# Patient Record
Sex: Female | Born: 1970 | Race: Black or African American | Hispanic: No | Marital: Single | Smoking: Never smoker
Health system: Southern US, Community
[De-identification: ages and names within clinical notes are randomized; demographics above are authoritative.]

## PROBLEM LIST (undated history)

## (undated) DIAGNOSIS — R87629 Unspecified abnormal cytological findings in specimens from vagina: Secondary | ICD-10-CM

## (undated) DIAGNOSIS — D219 Benign neoplasm of connective and other soft tissue, unspecified: Secondary | ICD-10-CM

## (undated) HISTORY — PX: LEEP: SHX91

## (undated) HISTORY — DX: Unspecified abnormal cytological findings in specimens from vagina: R87.629

## (undated) HISTORY — DX: Benign neoplasm of connective and other soft tissue, unspecified: D21.9

---

## 2014-03-13 DIAGNOSIS — R011 Cardiac murmur, unspecified: Secondary | ICD-10-CM | POA: Insufficient documentation

## 2014-03-13 DIAGNOSIS — Z9889 Other specified postprocedural states: Secondary | ICD-10-CM | POA: Insufficient documentation

## 2015-08-14 DIAGNOSIS — D649 Anemia, unspecified: Secondary | ICD-10-CM | POA: Insufficient documentation

## 2018-09-06 ENCOUNTER — Other Ambulatory Visit: Payer: Self-pay | Admitting: Allergy

## 2018-09-06 ENCOUNTER — Ambulatory Visit
Admission: RE | Admit: 2018-09-06 | Discharge: 2018-09-06 | Disposition: A | Discharge status: Home / Self Care | Payer: BLUE CROSS/BLUE SHIELD | Source: Ambulatory Visit | Attending: Allergy | Admitting: Allergy

## 2018-09-06 DIAGNOSIS — R059 Cough, unspecified: Secondary | ICD-10-CM

## 2018-09-06 DIAGNOSIS — R05 Cough: Secondary | ICD-10-CM

## 2018-12-10 ENCOUNTER — Inpatient Hospital Stay (HOSPITAL_COMMUNITY)
Admission: AD | Admit: 2018-12-10 | Payer: BC Managed Care – PPO | Source: Home / Self Care | Admitting: Obstetrics and Gynecology

## 2019-06-06 ENCOUNTER — Other Ambulatory Visit: Payer: Self-pay | Admitting: Family Medicine

## 2019-06-06 DIAGNOSIS — Z1231 Encounter for screening mammogram for malignant neoplasm of breast: Secondary | ICD-10-CM

## 2019-06-27 NOTE — Progress Notes (Signed)
   GYNECOLOGY OFFICE VISIT NOTE  History:   Brianna Sandoval is a 49 y.o. G1P0010 here today for discussion about infertility.  Really desires pregnancy.  Had one SAB at 9 weeks last year, needed misoprostol treatment. Has been evaluated recently by GYN providers at [facility] and referred to REI there, but she wants REI providers closer to [place].  She denies any abnormal vaginal discharge, bleeding, pelvic pain or other concerns.    History reviewed. No pertinent past medical history.  History reviewed. No pertinent surgical history.  The following portions of the patient's history were reviewed and updated as appropriate: allergies, current medications, past family history, past medical history, past social history, past surgical history and problem list.   Health Maintenance:  Normal pap and positive HRHPV 16/18 on 10/2018, follow up ECC negative.  Normal mammogram last year, scheduled for one on 07/06/2019.   Review of Systems:  Pertinent items noted in HPI and remainder of comprehensive ROS otherwise negative.  Physical Exam:  BP 132/85   Pulse 72   Wt 237 lb (107.5 kg)   LMP 06/25/2019  CONSTITUTIONAL: Well-developed, well-nourished female in no acute distress.  SKIN: No rash noted. Not diaphoretic. No erythema. No pallor. MUSCULOSKELETAL: Normal range of motion. No edema noted. NEUROLOGIC: Alert and oriented to person, place, and time. Normal muscle tone coordination. No cranial nerve deficit noted. PSYCHIATRIC: Normal mood and affect. Normal behavior. Normal judgment and thought content. CARDIOVASCULAR: Normal heart rate noted RESPIRATORY: Effort and breath sounds normal, no problems with respiration noted ABDOMEN: No masses noted. No other overt distention noted.   PELVIC: Deferred     Assessment and Plan:    1. Infertility, female Discussed chances of fertility at this advanced age, recommended referral to [facility] for further evaluation and management.  Discussed increased risk  of miscarriage also due to age, increased risk of chromosomal anomalies.  She was told that one of the options discussed will be use of donor eggs.  Patient agreed to referral to Dr. Kerin Perna and his team. - Ambulatory referral to Infertility Routine preventative health maintenance measures emphasized; will return in July or August for repeat pap smear. Please refer to After Visit Summary for other counseling recommendations.   Return for any gynecologic concerns.    Total face-to-face time with patient: 15 minutes.  Over 50% of encounter was spent on counseling and coordination of care.   Verita Schneiders, MD, [facility] [employer], [place]

## 2019-06-28 ENCOUNTER — Other Ambulatory Visit: Payer: Self-pay

## 2019-06-28 ENCOUNTER — Encounter: Payer: Self-pay | Admitting: Obstetrics & Gynecology

## 2019-06-28 ENCOUNTER — Ambulatory Visit (INDEPENDENT_AMBULATORY_CARE_PROVIDER_SITE_OTHER): Payer: 59 | Admitting: Obstetrics & Gynecology

## 2019-06-28 VITALS — BP 132/85 | HR 72 | Wt 237.0 lb

## 2019-06-28 DIAGNOSIS — N979 Female infertility, unspecified: Secondary | ICD-10-CM | POA: Diagnosis not present

## 2019-06-28 NOTE — Patient Instructions (Signed)
Female Infertility  Female infertility refers to a woman's inability to get pregnant (conceive) after a year of having sex regularly (or after 6 months in women over age 48) without using birth control. Infertility can also mean that a woman is not able to carry a pregnancy to full term. Both women and men can have fertility problems. What are the causes? This condition may be caused by:  Problems with reproductive organs. Infertility can result if a woman: ? Has an abnormally short cervix or a cervix that does not remain closed during a pregnancy. ? Has a blockage or scarring in the fallopian tubes. ? Has an abnormally shaped uterus. ? Has uterine fibroids. This is a benign mass of tissue or muscle (tumor) that can develop in the uterus. ? Is not ovulating in a regular way.  Certain medical conditions. These may include: ? Polycystic ovary syndrome (PCOS). This is a hormonal disorder that can cause small cysts to grow on the ovaries. This is the most common cause of infertility in women. ? Endometriosis. This is a condition in which the tissue that lines the uterus (endometrium) grows outside of its normal location. ? Cancer and cancer treatments, such as chemotherapy or radiation. ? Premature ovarian failure. This is when ovaries stop producing eggs and hormones before age 43. ? Sexually transmitted diseases, such as chlamydia or gonorrhea. ? Autoimmune disorders. These are disorders in which the body's defense system (immune system) attacks normal, healthy cells. Infertility can be linked to more than one cause. For some women, the cause of infertility is not known (unexplained infertility). What increases the risk?  Age. A woman's fertility declines with age, especially after her mid-62s.  Being underweight or overweight.  Drinking too much alcohol.  Using drugs such as anabolic steroids, cocaine, and marijuana.  Exercising excessively.  Being exposed to environmental toxins,  such as radiation, pesticides, and certain chemicals. What are the signs or symptoms? The main sign of infertility in women is the inability to get pregnant or carry a pregnancy to full term. How is this diagnosed? This condition may be diagnosed by:  Checking whether you are ovulating each month. The tests may include: ? Blood tests to check hormone levels. ? An ultrasound of the ovaries. ? Taking a small tissue that lines the uterus and checking it under a microscope (endometrial biopsy).  Doing additional tests. This is done if ovulation is normal. Tests may include: ? Hysterosalpingography. This X-ray test can show the shape of the uterus and whether the fallopian tubes are open. ? Laparoscopy. This test uses a lighted tube (laparoscope) to look for problems in the fallopian tubes and other organs. ? Transvaginal ultrasound. This imaging test is used to check for abnormalities in the uterus and ovaries. ? Hysteroscopy. This test uses a lighted tube to check for problems in the cervix and the uterus. To be diagnosed with infertility, both partners will have a physical exam. Both partners will also have an extensive medical and sexual history taken. Additional tests may be done. How is this treated? Treatment depends on the cause of infertility. Most cases of infertility in women are treated with medicine or surgery.  Women may take medicine to: ? Correct ovulation problems. ? Treat other health conditions.  Surgery may be done to: ? Repair damage to the ovaries, fallopian tubes, cervix, or uterus. ? Remove growths from the uterus. ? Remove scar tissue from the uterus, pelvis, or other organs. Assisted reproductive technology (ART) Assisted reproductive technology (  ART) refers to all treatments and procedures that combine eggs and sperm outside the body to try to help a couple conceive. ART is often combined with fertility drugs to stimulate ovulation. Sometimes ART is done using eggs  retrieved from another woman's body (donor eggs) or from previously frozen fertilized eggs (embryos). There are different types of ART. These include:  Intrauterine insemination (IUI). A long, thin tube is used to place sperm directly into a woman's uterus. This procedure: ? Is effective for infertility caused by sperm problems, including low sperm count and low motility. ? Can be used in combination with fertility drugs.  In vitro fertilization (IVF). This is done when a woman's fallopian tubes are blocked or when a man has low sperm count. In this procedure: ? Fertility drugs are used to stimulate the ovaries to produce multiple eggs. ? Once mature, these eggs are removed from the body and combined with the sperm to be fertilized. ? The fertilized eggs are then placed into the woman's uterus. Follow these instructions at home:  Take over-the-counter and prescription medicines only as told by your health care provider.  Do not use any products that contain nicotine or tobacco, such as cigarettes and e-cigarettes. If you need help quitting, ask your health care provider.  If you drink alcohol, limit how much you have to 1 drink a day.  Make dietary changes to lose weight or maintain a healthy weight. Work with your health care provider and a dietitian to set a weight-loss goal that is healthy and reasonable for you.  Seek support from a counselor or support group to talk about your concerns related to infertility. Couples counseling may be helpful for you and your partner.  Practice stress reduction techniques that work well for you, such as regular physical activity, meditation, or deep breathing.  Keep all follow-up visits as told by your health care provider. This is important. Contact a health care provider if you:  Feel that stress is interfering with your life and relationships.  Have side effects from treatments for infertility. Summary  Female infertility refers to a woman's  inability to get pregnant (conceive) after a year of having sex regularly (or after 6 months in women over age 35) without using birth control.  To be diagnosed with infertility, both partners will have a physical exam. Both partners will also have an extensive medical and sexual history taken.  Seek support from a counselor or support group to talk about your concerns related to infertility. Couples counseling may be helpful for you and your partner. This information is not intended to replace advice given to you by your health care provider. Make sure you discuss any questions you have with your health care provider. Document Revised: 07/15/2018 Document Reviewed: 02/23/2017 Elsevier Patient Education  2020 Elsevier Inc.  

## 2019-07-06 ENCOUNTER — Ambulatory Visit
Admission: RE | Admit: 2019-07-06 | Discharge: 2019-07-06 | Disposition: A | Discharge status: Home / Self Care | Payer: 59 | Source: Ambulatory Visit | Attending: Family Medicine | Admitting: Family Medicine

## 2019-07-06 ENCOUNTER — Other Ambulatory Visit: Payer: Self-pay

## 2019-07-06 DIAGNOSIS — Z1231 Encounter for screening mammogram for malignant neoplasm of breast: Secondary | ICD-10-CM

## 2019-07-08 ENCOUNTER — Other Ambulatory Visit: Payer: Self-pay | Admitting: Family Medicine

## 2019-07-08 DIAGNOSIS — R928 Other abnormal and inconclusive findings on diagnostic imaging of breast: Secondary | ICD-10-CM

## 2019-07-22 ENCOUNTER — Other Ambulatory Visit: Payer: Self-pay

## 2019-07-22 ENCOUNTER — Ambulatory Visit
Admission: RE | Admit: 2019-07-22 | Discharge: 2019-07-22 | Disposition: A | Discharge status: Home / Self Care | Payer: 59 | Source: Ambulatory Visit | Attending: Family Medicine | Admitting: Family Medicine

## 2019-07-22 DIAGNOSIS — R928 Other abnormal and inconclusive findings on diagnostic imaging of breast: Secondary | ICD-10-CM

## 2019-12-21 ENCOUNTER — Ambulatory Visit (INDEPENDENT_AMBULATORY_CARE_PROVIDER_SITE_OTHER): Payer: 59 | Admitting: Family Medicine

## 2019-12-21 ENCOUNTER — Other Ambulatory Visit (HOSPITAL_COMMUNITY)
Admission: RE | Admit: 2019-12-21 | Discharge: 2019-12-21 | Disposition: A | Discharge status: Home / Self Care | Payer: 59 | Source: Ambulatory Visit | Attending: Family Medicine | Admitting: Family Medicine

## 2019-12-21 ENCOUNTER — Encounter: Payer: Self-pay | Admitting: Family Medicine

## 2019-12-21 ENCOUNTER — Ambulatory Visit: Payer: 59 | Admitting: Family Medicine

## 2019-12-21 ENCOUNTER — Other Ambulatory Visit: Payer: Self-pay

## 2019-12-21 VITALS — BP 133/83 | HR 61 | Ht 69.0 in | Wt 235.0 lb

## 2019-12-21 DIAGNOSIS — N898 Other specified noninflammatory disorders of vagina: Secondary | ICD-10-CM | POA: Diagnosis not present

## 2019-12-21 DIAGNOSIS — N979 Female infertility, unspecified: Secondary | ICD-10-CM

## 2019-12-21 DIAGNOSIS — Z124 Encounter for screening for malignant neoplasm of cervix: Secondary | ICD-10-CM | POA: Diagnosis not present

## 2019-12-21 DIAGNOSIS — Z01419 Encounter for gynecological examination (general) (routine) without abnormal findings: Secondary | ICD-10-CM

## 2019-12-21 NOTE — Patient Instructions (Signed)

## 2019-12-21 NOTE — Progress Notes (Signed)
  Subjective:     Brianna Sandoval is a 49 y.o. female and is here for a comprehensive physical exam. The patient reports problems - vaginal irritation, following intercourse. Regular cycles, partner has children already, still awaiting w/u for infertility.  She is aware that she will likely need donor eggs given her age.  Reports some vaginal irritation.  The following portions of the patient's history were reviewed and updated as appropriate: allergies, current medications, past family history, past medical history, past social history, past surgical history and problem list.  Review of Systems Pertinent items noted in HPI and remainder of comprehensive ROS otherwise negative.   Objective:    BP 133/83   Pulse 61   Ht 5\' 9"  (1.753 m)   Wt 235 lb (106.6 kg)   LMP 11/26/2019 (Exact Date)   BMI 34.70 kg/m  General appearance: alert, cooperative and appears stated age Head: Normocephalic, without obvious abnormality, atraumatic Neck: no adenopathy, supple, symmetrical, trachea midline and thyroid not enlarged, symmetric, no tenderness/mass/nodules Lungs: clear to auscultation bilaterally Breasts: normal appearance, no masses or tenderness Heart: regular rate and rhythm, S1, S2 normal, no murmur, click, rub or gallop Abdomen: soft, non-tender; bowel sounds normal; no masses,  no organomegaly Pelvic: cervix normal in appearance, external genitalia normal, no adnexal masses or tenderness, no cervical motion tenderness, uterus normal size, shape, and consistency and vagina normal without discharge Extremities: extremities normal, atraumatic, no cyanosis or edema Pulses: 2+ and symmetric Skin: Skin color, texture, turgor normal. No rashes or lesions Lymph nodes: Cervical, supraclavicular, and axillary nodes normal. Neurologic: Grossly normal    Assessment:    Healthy GYN female exam.      Plan:   Problem List Items Addressed This Visit      Unprioritized   Infertility,  female    Check HSG.  Referral to REI made.      Relevant Orders   DG Hysterogram (HSG)    Other Visit Diagnoses    Screening for malignant neoplasm of cervix    -  Primary   Relevant Orders   Cytology - PAP   Vaginal irritation       Relevant Orders   Cervicovaginal ancillary only (Completed)   Encounter for gynecological examination without abnormal finding         Return in 1 year (on 12/20/2020).    See After Visit Summary for Counseling Recommendations

## 2019-12-21 NOTE — Progress Notes (Signed)
Discuss HSG

## 2019-12-22 DIAGNOSIS — N979 Female infertility, unspecified: Secondary | ICD-10-CM | POA: Insufficient documentation

## 2019-12-22 LAB — CERVICOVAGINAL ANCILLARY ONLY
Bacterial Vaginitis (gardnerella): NEGATIVE
Candida Glabrata: NEGATIVE
Candida Vaginitis: NEGATIVE
Comment: NEGATIVE
Comment: NEGATIVE
Comment: NEGATIVE

## 2019-12-22 NOTE — Assessment & Plan Note (Signed)
Check HSG.  Referral to REI made.

## 2019-12-23 LAB — CYTOLOGY - PAP
Comment: NEGATIVE
Diagnosis: NEGATIVE
High risk HPV: NEGATIVE

## 2020-01-02 ENCOUNTER — Ambulatory Visit
Admission: RE | Admit: 2020-01-02 | Discharge: 2020-01-02 | Disposition: A | Discharge status: Home / Self Care | Payer: 59 | Source: Ambulatory Visit | Attending: Family Medicine | Admitting: Family Medicine

## 2020-01-02 DIAGNOSIS — N979 Female infertility, unspecified: Secondary | ICD-10-CM

## 2020-01-04 ENCOUNTER — Telehealth: Payer: Self-pay

## 2020-01-04 NOTE — Telephone Encounter (Signed)
Patient called office back in regards to voicemail. Patient given results of procedure and states infertility has not reached out to her. Informed patient the referral was put in and we would investigate and give her a call back with an answer. Patient voiced understanding.

## 2020-01-04 NOTE — Telephone Encounter (Signed)
-----   Message from Donnamae Jude, MD sent at 01/03/2020  1:59 PM EDT ----- Her HSG is normal

## 2020-04-13 ENCOUNTER — Other Ambulatory Visit: Payer: Self-pay

## 2020-04-13 ENCOUNTER — Ambulatory Visit (INDEPENDENT_AMBULATORY_CARE_PROVIDER_SITE_OTHER): Payer: 59

## 2020-04-13 VITALS — BP 127/84 | HR 67 | Wt 237.4 lb

## 2020-04-13 DIAGNOSIS — Z3200 Encounter for pregnancy test, result unknown: Secondary | ICD-10-CM

## 2020-04-13 LAB — POCT URINE PREGNANCY: Preg Test, Ur: NEGATIVE

## 2020-04-13 NOTE — Progress Notes (Signed)
Patient was assessed and managed by nursing staff during this encounter. I have reviewed the chart and agree with the documentation and plan. I have also made any necessary editorial changes.  Mora Bellman, MD 04/13/2020 11:01 AM

## 2020-04-13 NOTE — Progress Notes (Signed)
Pt presents for UPT. Pt states lmp is 03/11/2020.

## 2020-06-28 ENCOUNTER — Other Ambulatory Visit: Payer: Self-pay | Admitting: Family Medicine

## 2020-11-01 ENCOUNTER — Other Ambulatory Visit: Payer: Self-pay | Admitting: Family Medicine

## 2020-11-01 DIAGNOSIS — Z1231 Encounter for screening mammogram for malignant neoplasm of breast: Secondary | ICD-10-CM

## 2020-11-06 ENCOUNTER — Encounter: Payer: Self-pay | Admitting: Radiology

## 2020-11-07 ENCOUNTER — Ambulatory Visit
Admission: RE | Admit: 2020-11-07 | Discharge: 2020-11-07 | Disposition: A | Discharge status: Home / Self Care | Payer: 59 | Source: Ambulatory Visit | Attending: Family Medicine | Admitting: Family Medicine

## 2020-11-07 ENCOUNTER — Other Ambulatory Visit: Payer: Self-pay

## 2020-11-07 DIAGNOSIS — Z1231 Encounter for screening mammogram for malignant neoplasm of breast: Secondary | ICD-10-CM

## 2021-03-06 ENCOUNTER — Ambulatory Visit: Payer: BC Managed Care – PPO | Admitting: Family Medicine

## 2021-04-24 ENCOUNTER — Encounter: Payer: Self-pay | Admitting: Family Medicine

## 2021-04-24 ENCOUNTER — Other Ambulatory Visit: Payer: Self-pay

## 2021-04-24 ENCOUNTER — Ambulatory Visit (INDEPENDENT_AMBULATORY_CARE_PROVIDER_SITE_OTHER): Payer: BC Managed Care – PPO | Admitting: Family Medicine

## 2021-04-24 VITALS — BP 140/80 | HR 58 | Ht 69.0 in | Wt 230.0 lb

## 2021-04-24 DIAGNOSIS — Z9889 Other specified postprocedural states: Secondary | ICD-10-CM

## 2021-04-24 DIAGNOSIS — Z01411 Encounter for gynecological examination (general) (routine) with abnormal findings: Secondary | ICD-10-CM | POA: Diagnosis not present

## 2021-04-24 DIAGNOSIS — N979 Female infertility, unspecified: Secondary | ICD-10-CM

## 2021-04-24 NOTE — Assessment & Plan Note (Addendum)
99242 Normal HSG and semen analysis. To go to [facility] to see about infertility treatments. Discussed donor eggs, she has previously declined this. She wanted her fibroids removed--though seen during an early pregnancy--discussed re-assessment of this, she declined u/s and will f/u with REI.

## 2021-04-24 NOTE — Progress Notes (Signed)
Subjective:     Brianna Sandoval is a 51 y.o. female and is here for a comprehensive physical exam. The patient reports problems - cycles have lengthened . Still interested in fertility.   The following portions of the patient's history were reviewed and updated as appropriate: allergies, current medications, past family history, past medical history, past social history, past surgical history, and problem list.  Review of Systems Pertinent items noted in HPI and remainder of comprehensive ROS otherwise negative.   Objective:    BP 140/80    Pulse (!) 58    Ht 5\' 9"  (1.753 m)    Wt 230 lb (104.3 kg)    LMP 04/16/2021    BMI 33.97 kg/m  General appearance: alert, cooperative, and appears stated age Head: Normocephalic, without obvious abnormality, atraumatic Neck: no adenopathy, supple, symmetrical, trachea midline, and thyroid not enlarged, symmetric, no tenderness/mass/nodules Lungs: clear to auscultation bilaterally Breasts: normal appearance, no masses or tenderness Heart: regular rate and rhythm, S1, S2 normal, no murmur, click, rub or gallop Abdomen: soft, non-tender; bowel sounds normal; no masses,  no organomegaly Pelvic: external genitalia normal, no adnexal masses or tenderness, no cervical motion tenderness, and uterus normal size, shape, and consistency Extremities: extremities normal, atraumatic, no cyanosis or edema Pulses: 2+ and symmetric Skin: Skin color, texture, turgor normal. No rashes or lesions Lymph nodes: Cervical, supraclavicular, and axillary nodes normal. Neurologic: Grossly normal    Assessment:    Healthy female exam.      Plan:   Problem List Items Addressed This Visit       Unprioritized   History of loop electrical excision procedure (LEEP)    27741 Has + HPV in 2020. Last pap 2021 WNL with Neg HPV. Repeat in 2024.      Infertility, female    [phone] Normal HSG and semen analysis. To go to [facility] to see about infertility treatments.  Discussed donor eggs, she has previously declined this. She wanted her fibroids removed--though seen during an early pregnancy--discussed re-assessment of this, she declined u/s and will f/u with REI.      Other Visit Diagnoses     Encounter for gynecological examination with abnormal finding    -  Primary   [phone] Has had mammogram--f/u in August, No pap this year. Normal exam      Return in 1 year (on 04/24/2022).    See After Visit Summary for Counseling Recommendations

## 2021-04-24 NOTE — Progress Notes (Signed)
Last pap- 12/11/19- negative Last mamm- 11/09/20- normal Pt would like to discuss fibroids and getting pregnant Pt declined flu shot

## 2021-04-24 NOTE — Assessment & Plan Note (Signed)
99242 Has + HPV in 2020. Last pap 2021 WNL with Neg HPV. Repeat in 2024.

## 2022-02-19 ENCOUNTER — Other Ambulatory Visit: Payer: Self-pay | Admitting: Family Medicine

## 2022-02-19 DIAGNOSIS — R110 Nausea: Secondary | ICD-10-CM

## 2022-02-25 ENCOUNTER — Other Ambulatory Visit: Payer: BC Managed Care – PPO

## 2022-04-16 ENCOUNTER — Telehealth: Payer: Self-pay | Admitting: Family Medicine

## 2022-04-23 ENCOUNTER — Ambulatory Visit: Payer: Self-pay | Admitting: Family Medicine

## 2023-05-29 IMAGING — MG MM DIGITAL SCREENING BILAT W/ TOMO AND CAD
8 series · 8 of 24 positions shown · non-contrast
Comparison: Previous exam(s).

CLINICAL DATA: Screening.

EXAM:
DIGITAL SCREENING BILATERAL MAMMOGRAM WITH TOMOSYNTHESIS AND CAD
TECHNIQUE: Bilateral screening digital craniocaudal and mediolateral oblique
mammograms were obtained. Bilateral screening digital breast
tomosynthesis was performed. The images were evaluated with
computer-aided detection.

[L CC synth-2D]
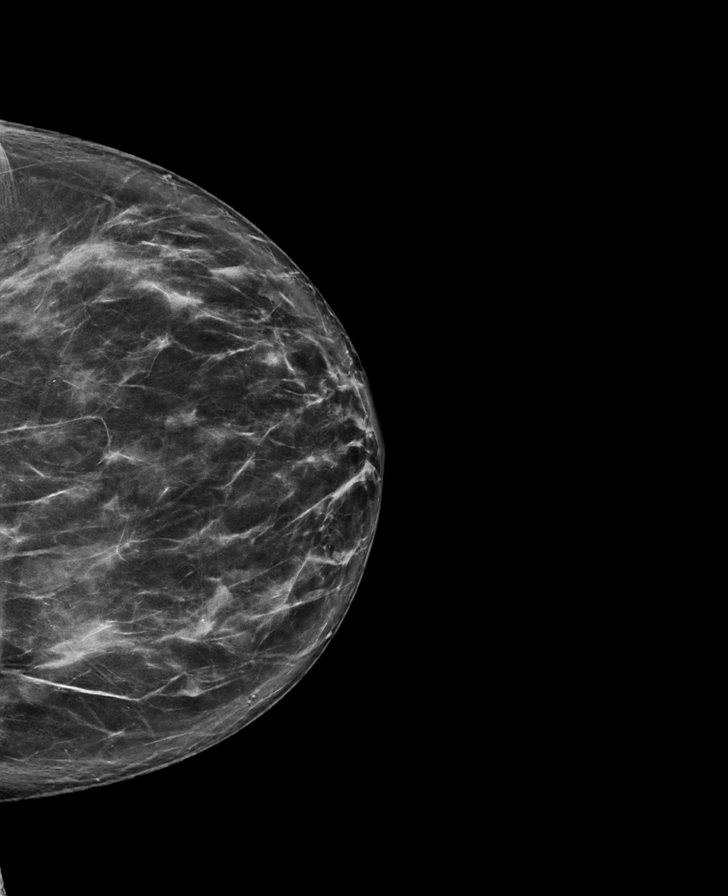

[R CC synth-2D]
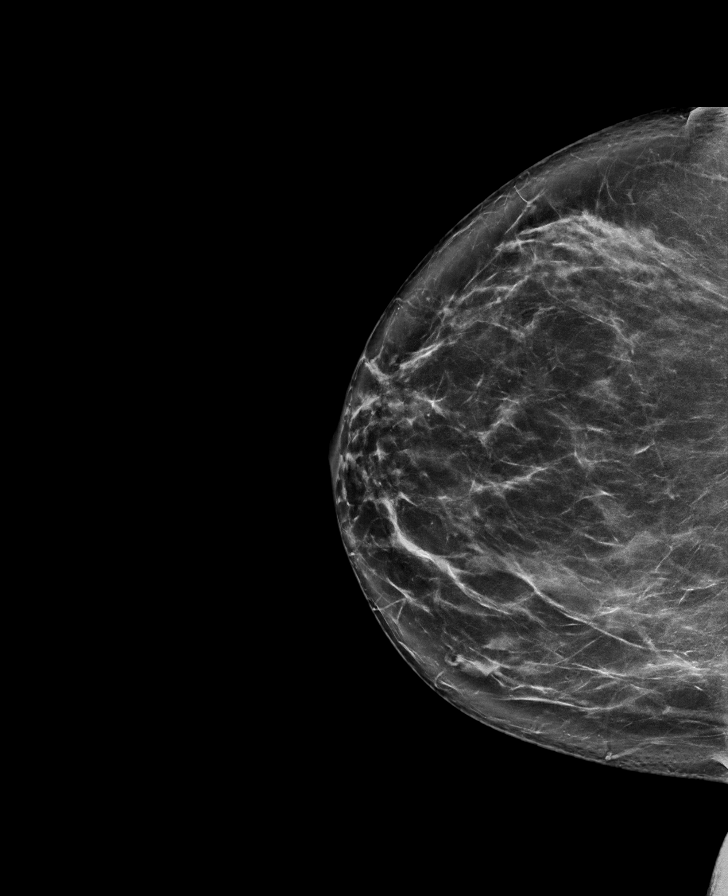

[L MLO synth-2D]
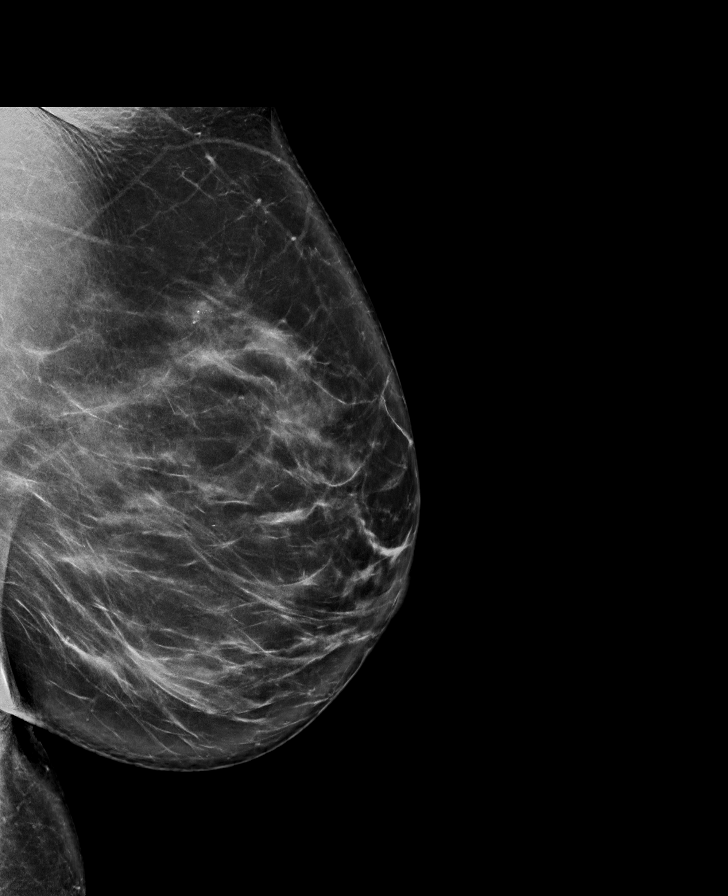

[R MLO synth-2D]
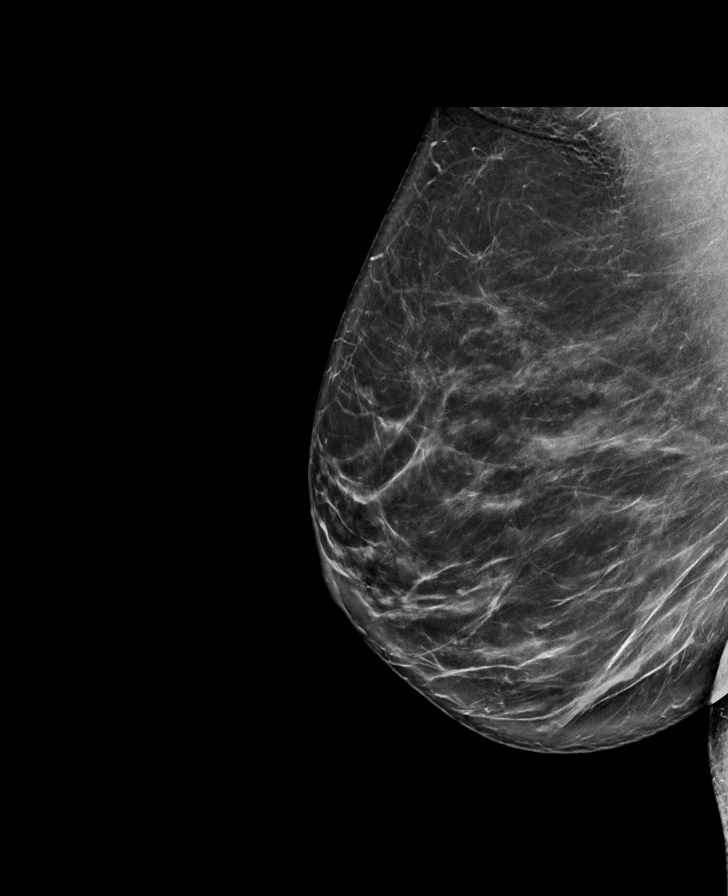

[R MLO tomo · tomo slice 46/91.0]
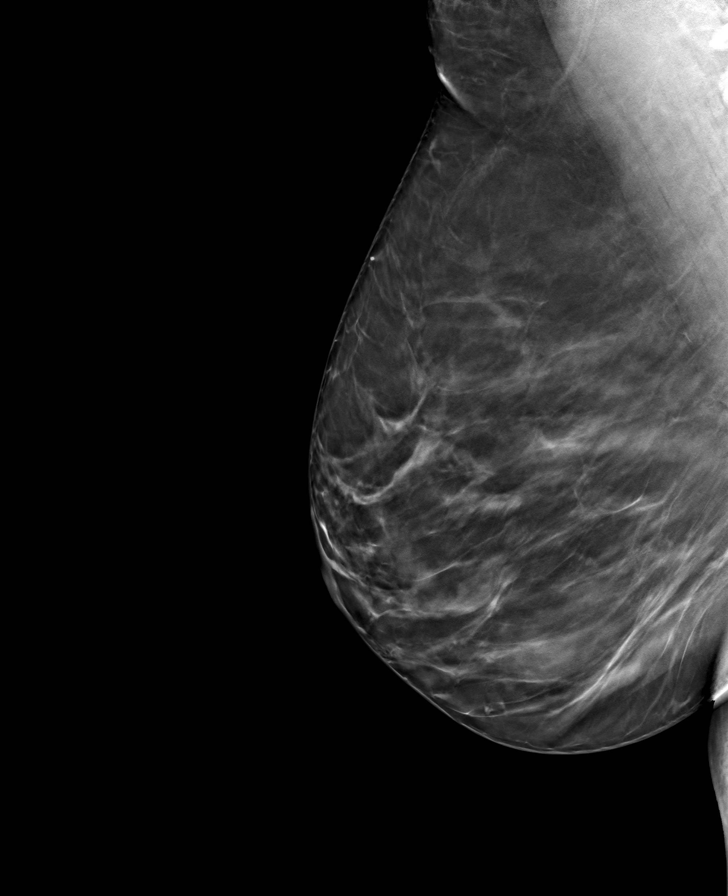

[L CC tomo · tomo slice 45/89.0]
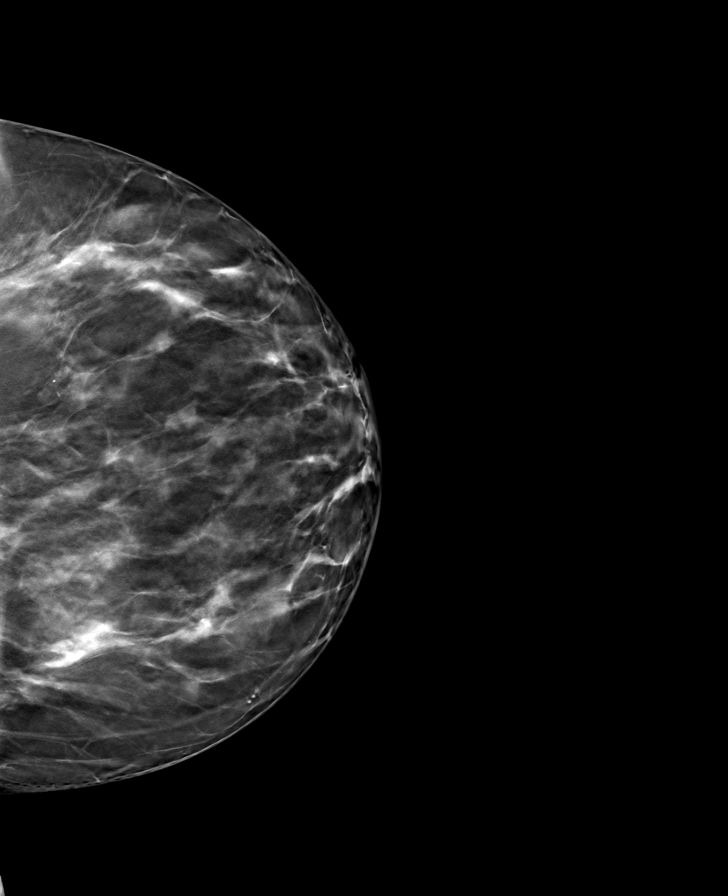

[R CC tomo · tomo slice 45/89.0]
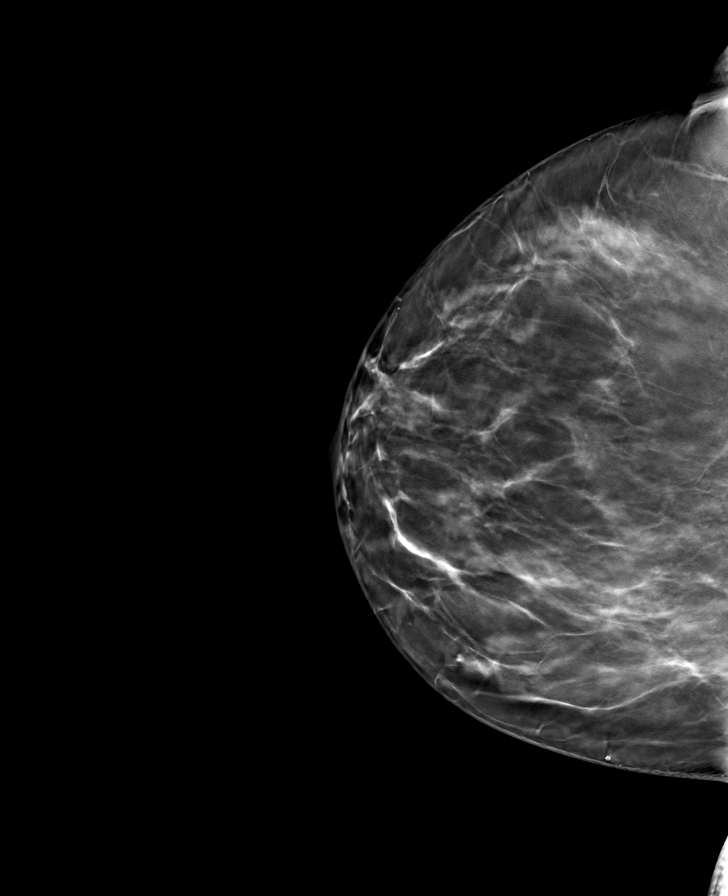

[L MLO tomo · tomo slice 50/99.0]
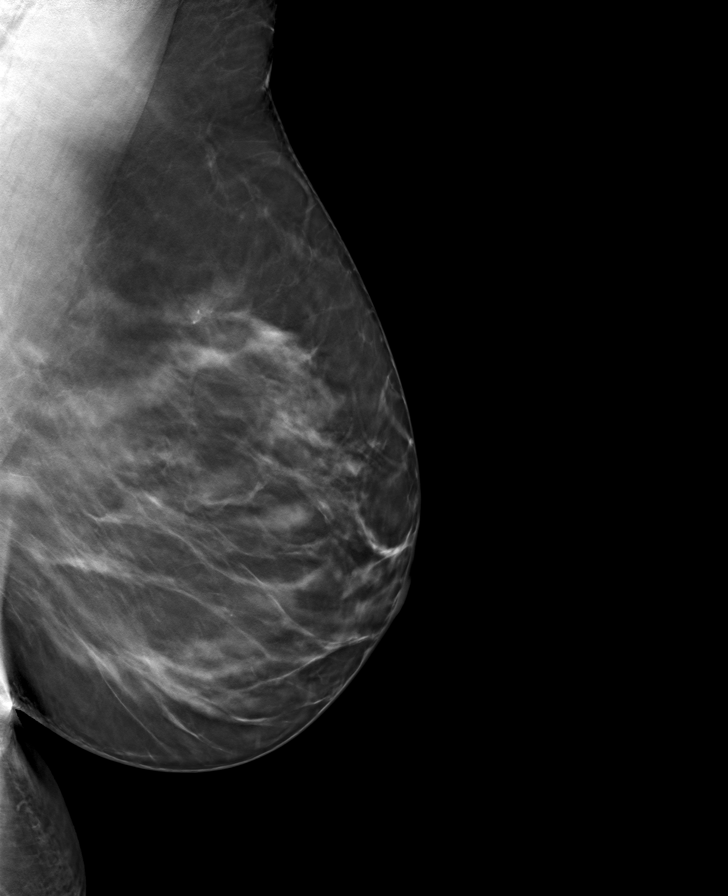

[8 of 24 positions shown; findings below may reference images not displayed]

ACR Breast Density Category c: The breast tissue is heterogeneously
dense, which may obscure small masses.
FINDINGS: There are no findings suspicious for malignancy.
IMPRESSION: No mammographic evidence of malignancy. A result letter of this
screening mammogram will be mailed directly to the patient.

RECOMMENDATION:
Screening mammogram in one year. (Code:Q3-W-BC3)

BI-RADS CATEGORY  1: Negative.
